# Patient Record
Sex: Male | Born: 2005 | Race: White | Hispanic: No | Marital: Single | Smoking: Current some day smoker
Health system: Southern US, Community
[De-identification: ages and names within clinical notes are randomized; demographics above are authoritative.]

## PROBLEM LIST (undated history)

## (undated) DIAGNOSIS — S02109A Fracture of base of skull, unspecified side, initial encounter for closed fracture: Secondary | ICD-10-CM

## (undated) HISTORY — PX: ADENOIDECTOMY: SUR15

---
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
## 2024-04-07 ENCOUNTER — Other Ambulatory Visit: Payer: Self-pay

## 2024-04-07 ENCOUNTER — Emergency Department
Admission: EM | Admit: 2024-04-07 | Discharge: 2024-04-07 | Disposition: A | Discharge status: Home / Self Care | Attending: Emergency Medicine | Admitting: Emergency Medicine

## 2024-04-07 DIAGNOSIS — R059 Cough, unspecified: Secondary | ICD-10-CM | POA: Insufficient documentation

## 2024-04-07 DIAGNOSIS — J029 Acute pharyngitis, unspecified: Secondary | ICD-10-CM | POA: Insufficient documentation

## 2024-04-07 LAB — RESP PANEL BY RT-PCR (RSV, FLU A&B, COVID)  RVPGX2
Influenza A by PCR: NEGATIVE
Influenza B by PCR: NEGATIVE
Resp Syncytial Virus by PCR: NEGATIVE
SARS Coronavirus 2 by RT PCR: NEGATIVE

## 2024-04-07 LAB — GROUP A STREP BY PCR: Group A Strep by PCR: NOT DETECTED

## 2024-04-07 MED ORDER — ACETAMINOPHEN 325 MG PO TABS
650.0000 mg | ORAL_TABLET | Freq: Once | ORAL | Status: AC
  Administered 2024-04-07: 650 mg via ORAL
  Filled 2024-04-07: qty 2

## 2024-04-07 NOTE — ED Triage Notes (Signed)
 Pt to ED for sore throat since 1 week.  States his mother was sick recently.  Respirations unlabored.

## 2024-04-07 NOTE — Discharge Instructions (Signed)
 Please take Tylenol /ibuprofen per package instructions to help with your symptoms.  Please return for any new, worsening, or changing symptoms or other concerns.  It was a pleasure caring for you today.

## 2024-04-07 NOTE — ED Provider Notes (Signed)
 [facility] Provider Note    Event Date/Time   First MD Initiated Contact with Patient 04/07/24 1156     (approximate)   History   Sore Throat   HPI  Ethan Leonard is a 18 y.o. male who presents today for evaluation of sore throat.  Multiple patient's family members are sick with the same symptoms.  Patient also reports cough and nasal congestion.  No fevers or chills.  No difficulty breathing or swallowing.  No chest pain or shortness of breath.  Patient Active Problem List   Diagnosis Date Noted   Attention deficit hyperactivity disorder (ADHD), combined type 05/31/2022   Mild intellectual disability 05/31/2022   Intermittent explosive disorder 05/31/2022   DMDD (disruptive mood dysregulation disorder) 05/31/2022          Physical Exam   Triage Vital Signs: ED Triage Vitals  Encounter Vitals Group     BP 04/07/24 1134 131/89     Girls Systolic BP Percentile --      Girls Diastolic BP Percentile --      Boys Systolic BP Percentile --      Boys Diastolic BP Percentile --      Pulse Rate 04/07/24 1134 99     Resp 04/07/24 1134 20     Temp 04/07/24 1134 (!) 97.4 F (36.3 C)     Temp Source 04/07/24 1134 Oral     SpO2 04/07/24 1134 99 %     Weight 04/07/24 1136 215 lb (97.5 kg)     Height 04/07/24 1136 5' 9 (1.753 m)     Head Circumference --      Peak Flow --      Pain Score --      Pain Loc --      Pain Education --      Exclude from Growth Chart --     Most recent vital signs: Vitals:   04/07/24 1134  BP: 131/89  Pulse: 99  Resp: 20  Temp: (!) 97.4 F (36.3 C)  SpO2: 99%    Physical Exam Vitals and nursing note reviewed.  Constitutional:      General: Awake and alert. No acute distress.    Appearance: Normal appearance. The patient is normal weight.  HENT:     Head: Normocephalic and atraumatic.     Mouth: Mucous membranes are moist. Uvula midline.  No tonsillar exudate.  No soft palate fluctuance.  No trismus.   No voice change.  No sublingual swelling.  No tender cervical lymphadenopathy.  No nuchal rigidity Eyes:     General: PERRL. Normal EOMs        Right eye: No discharge.        Left eye: No discharge.     Conjunctiva/sclera: Conjunctivae normal.  Cardiovascular:     Rate and Rhythm: Normal rate and regular rhythm.     Pulses: Normal pulses.  Pulmonary:     Effort: Pulmonary effort is normal. No respiratory distress.     Breath sounds: Normal breath sounds.  Abdominal:     Abdomen is soft. There is no abdominal tenderness. No rebound or guarding. No distention. Musculoskeletal:        General: No swelling. Normal range of motion.     Cervical back: Normal range of motion and neck supple.  Skin:    General: Skin is warm and dry.     Capillary Refill: Capillary refill takes less than 2 seconds.     Findings: No rash.  Neurological:     Mental Status: The patient is awake and alert.      ED Results / Procedures / Treatments   Labs (all labs ordered are listed, but only abnormal results are displayed) Labs Reviewed  GROUP A STREP BY PCR  RESP PANEL BY RT-PCR (RSV, FLU A&B, COVID)  [id number]     EKG     RADIOLOGY     PROCEDURES:  Critical Care performed:   Procedures   MEDICATIONS ORDERED IN ED: Medications  acetaminophen  (TYLENOL ) tablet 650 mg (650 mg Oral Given 04/07/24 1239)     IMPRESSION / MDM / ASSESSMENT AND PLAN / ED COURSE  I reviewed the triage vital signs and the nursing notes.   Differential diagnosis includes, but is not limited to, pharyngitis, strep pharyngitis, COVID, influenza, other URI.  Patient is awake and alert, hemodynamically stable and afebrile.  He is nontoxic in appearance.  Swabs obtained in triage.  Patient was treated symptomatically with Tylenol  with good effect.  Swabs are negative for COVID/flu/RSV and strep.  We discussed return precautions and outpatient follow-up.  Patient understands and agrees with plan.  Discharged in  stable condition.   Patient's presentation is most consistent with acute complicated illness / injury requiring diagnostic workup.    FINAL CLINICAL IMPRESSION(S) / ED DIAGNOSES   Final diagnoses:  Pharyngitis, unspecified etiology     Rx / DC Orders   ED Discharge Orders     None        Note:  This document was prepared using Dragon voice recognition software and may include unintentional dictation errors.   Nickole Adamek E, PA-C 04/07/24 1500    Ernest Ronal BRAVO, MD 04/08/24 [phone]
